# Patient Record
Sex: Female | Born: 1960 | Race: Black or African American | Hispanic: No | Marital: Married
Health system: Southern US, Community
[De-identification: ages and names within clinical notes are randomized; demographics above are authoritative.]

## PROBLEM LIST (undated history)

## (undated) DIAGNOSIS — E785 Hyperlipidemia, unspecified: Secondary | ICD-10-CM

## (undated) DIAGNOSIS — C50919 Malignant neoplasm of unspecified site of unspecified female breast: Secondary | ICD-10-CM

## (undated) HISTORY — PX: BREAST SURGERY: SHX581

---

## 2020-07-16 ENCOUNTER — Emergency Department (HOSPITAL_BASED_OUTPATIENT_CLINIC_OR_DEPARTMENT_OTHER): Payer: No Typology Code available for payment source

## 2020-07-16 ENCOUNTER — Other Ambulatory Visit: Payer: Self-pay

## 2020-07-16 ENCOUNTER — Encounter (HOSPITAL_BASED_OUTPATIENT_CLINIC_OR_DEPARTMENT_OTHER): Payer: Self-pay | Admitting: *Deleted

## 2020-07-16 ENCOUNTER — Emergency Department (HOSPITAL_BASED_OUTPATIENT_CLINIC_OR_DEPARTMENT_OTHER)
Admission: EM | Admit: 2020-07-16 | Discharge: 2020-07-16 | Disposition: A | Discharge status: Home / Self Care | Payer: No Typology Code available for payment source | Attending: Emergency Medicine | Admitting: Emergency Medicine

## 2020-07-16 DIAGNOSIS — R413 Other amnesia: Secondary | ICD-10-CM | POA: Diagnosis not present

## 2020-07-16 DIAGNOSIS — Z79899 Other long term (current) drug therapy: Secondary | ICD-10-CM | POA: Insufficient documentation

## 2020-07-16 DIAGNOSIS — I10 Essential (primary) hypertension: Secondary | ICD-10-CM | POA: Diagnosis not present

## 2020-07-16 DIAGNOSIS — R410 Disorientation, unspecified: Secondary | ICD-10-CM | POA: Insufficient documentation

## 2020-07-16 DIAGNOSIS — R519 Headache, unspecified: Secondary | ICD-10-CM

## 2020-07-16 HISTORY — DX: Malignant neoplasm of unspecified site of unspecified female breast: C50.919

## 2020-07-16 HISTORY — DX: Hyperlipidemia, unspecified: E78.5

## 2020-07-16 LAB — COMPREHENSIVE METABOLIC PANEL
ALT: 17 U/L (ref 0–44)
AST: 25 U/L (ref 15–41)
Albumin: 4 g/dL (ref 3.5–5.0)
Alkaline Phosphatase: 99 U/L (ref 38–126)
Anion gap: 13 (ref 5–15)
BUN: 11 mg/dL (ref 6–20)
CO2: 24 mmol/L (ref 22–32)
Calcium: 9.5 mg/dL (ref 8.9–10.3)
Chloride: 101 mmol/L (ref 98–111)
Creatinine, Ser: 0.9 mg/dL (ref 0.44–1.00)
GFR calc Af Amer: >60 mL/min (ref >60)
GFR calc non Af Amer: >60 mL/min (ref >60)
Glucose, Bld: 100 mg/dL — ABNORMAL HIGH (ref 70–99)
Potassium: 3.7 mmol/L (ref 3.5–5.1)
Sodium: 138 mmol/L (ref 135–145)
Total Bilirubin: 0.3 mg/dL (ref 0.3–1.2)
Total Protein: 8.2 g/dL — ABNORMAL HIGH (ref 6.5–8.1)

## 2020-07-16 LAB — CBC WITH DIFFERENTIAL/PLATELET
Abs Immature Granulocytes: 0.01 10*3/uL (ref 0.00–0.07)
Basophils Absolute: 0 10*3/uL (ref 0.0–0.1)
Basophils Relative: 0 %
Eosinophils Absolute: 0.2 10*3/uL (ref 0.0–0.5)
Eosinophils Relative: 3 %
HCT: 37.7 % (ref 36.0–46.0)
Hemoglobin: 12.1 g/dL (ref 12.0–15.0)
Immature Granulocytes: 0 %
Lymphocytes Relative: 36 %
Lymphs Abs: 2.7 10*3/uL (ref 0.7–4.0)
MCH: 27.2 pg (ref 26.0–34.0)
MCHC: 32.1 g/dL (ref 30.0–36.0)
MCV: 84.7 fL (ref 80.0–100.0)
Monocytes Absolute: 0.5 10*3/uL (ref 0.1–1.0)
Monocytes Relative: 6 %
Neutro Abs: 4.2 10*3/uL (ref 1.7–7.7)
Neutrophils Relative %: 55 %
Platelets: 263 10*3/uL (ref 150–400)
RBC: 4.45 MIL/uL (ref 3.87–5.11)
RDW: 15.6 % — ABNORMAL HIGH (ref 11.5–15.5)
WBC: 7.6 10*3/uL (ref 4.0–10.5)
nRBC: 0 % (ref 0.0–0.2)

## 2020-07-16 MED ORDER — ACETAMINOPHEN 325 MG PO TABS
650.0000 mg | ORAL_TABLET | Freq: Once | ORAL | Status: AC
  Administered 2020-07-16: 650 mg via ORAL
  Filled 2020-07-16: qty 2

## 2020-07-16 MED ORDER — AMLODIPINE BESYLATE 5 MG PO TABS
2.5000 mg | ORAL_TABLET | Freq: Once | ORAL | Status: AC
  Administered 2020-07-16: 2.5 mg via ORAL
  Filled 2020-07-16: qty 1

## 2020-07-16 MED ORDER — AMLODIPINE BESYLATE 2.5 MG PO TABS: 2.5000 mg | ORAL_TABLET | Freq: Every day | ORAL | 0 refills | Status: AC

## 2020-07-16 NOTE — ED Provider Notes (Signed)
[facility] EMERGENCY DEPARTMENT Provider Note   CSN: [id number] Arrival date & time: 07/16/20  1719     History Chief Complaint  Patient presents with  . Hypertension    Alicia Cabrera is a 59 y.o. female.  Patient with history of breast cancer, high cholesterol, no previous history of hypertension --presents to the emergency department for headache.  Patient reports that she woke up early this morning to take a flight to [place] from [place], to visit her brother.  While at the airport in [place], she developed a posterior headache.  This was mild at first.  During her flight her headache gradually worsened.  Upon arrival to [facility], she called her doctor who recommended that she be evaluated.  At urgent care she was found to have high blood pressure.  She was encouraged to come to the emergency department for evaluation of TIA, stroke, hypertensive emergency.  Here patient is requesting aspirin for her headache.  She denies vomiting or neck pain.  Patient denies signs of stroke including: facial droop, slurred speech, aphasia, weakness/numbness in extremities, imbalance/trouble walking.  She states that she has been getting headaches recently.  She is concerned that this is related to high blood pressure.  Patient also reports several episodes of confusion over the past 3 months.  She reports episodes where she has had difficulty finding her way to work as well as confusion while at work.  The symptoms are short-lived and pass.  She has not been seen for these episodes.  Patient denies any symptoms of illness including fevers, URI symptoms, cough, chest pain or shortness of breath.  She has not had any vomiting or diarrhea.        Past Medical History:  Diagnosis Date  . Breast CA ([place])   . Hyperlipidemia     There are no problems to display for this patient.   Past Surgical History:  Procedure Laterality Date  . BREAST SURGERY       OB History   No obstetric  history on file.     No family history on file.  Social History   Tobacco Use  . Smoking status: Not on file  Substance Use Topics  . Alcohol use: Not on file  . Drug use: Not on file    Home Medications Prior to Admission medications   Medication Sig Start Date End Date Taking? Authorizing Provider  amLODipine (NORVASC) 2.5 MG tablet Take 1 tablet (2.5 mg total) by mouth daily. 07/16/20   Carlisle Cater, PA-C  atorvastatin (LIPITOR) 20 MG tablet Take 20 mg by mouth at bedtime. 06/27/20   [provider]  Cholecalciferol 10 MCG (400 UNIT) CAPS Take by mouth.    [provider]  hydrocortisone 2.5 % cream Apply topically daily. 05/18/20   [provider]  ketoconazole (NIZORAL) 2 % cream Apply topically daily. 06/15/20   [provider]  triamcinolone cream (KENALOG) 0.1 % Apply topically 2 (two) times daily. 04/06/20   [provider]  triazolam (HALCION) 0.25 MG tablet Take by mouth as directed. 07/12/20   [provider]    Allergies    Patient has no known allergies.  Review of Systems   Review of Systems  Constitutional: Negative for fever.  HENT: Negative for congestion, dental problem, rhinorrhea and sinus pressure.   Eyes: Negative for photophobia, discharge, redness and visual disturbance.  Respiratory: Negative for shortness of breath.   Cardiovascular: Negative for chest pain.  Gastrointestinal: Negative for nausea and  vomiting.  Musculoskeletal: Negative for gait problem, neck pain and neck stiffness.  Skin: Negative for rash.  Neurological: Positive for headaches. Negative for syncope, speech difficulty, weakness, light-headedness and numbness.  Psychiatric/Behavioral: Positive for confusion.    Physical Exam Updated Vital Signs BP (!) 174/96 (BP Location: Right Arm)   Pulse 88   Temp 99.7 F (37.6 C) (Oral)   Resp 18   Ht 5\' 7"  (1.702 m)   Wt 92.1 kg   SpO2 99%   BMI 31.79 kg/m   Physical Exam Vitals  and nursing note reviewed.  Constitutional:      General: She is not in acute distress.    Appearance: She is well-developed.  HENT:     Head: Normocephalic and atraumatic.     Right Ear: Tympanic membrane, ear canal and external ear normal.     Left Ear: Tympanic membrane, ear canal and external ear normal.     Nose: Nose normal.     Mouth/Throat:     Pharynx: Uvula midline.  Eyes:     General: Lids are normal. No visual field deficit.    Extraocular Movements:     Right eye: No nystagmus.     Left eye: No nystagmus.     Conjunctiva/sclera: Conjunctivae normal.     Pupils: Pupils are equal, round, and reactive to light.  Neck:     Vascular: No carotid bruit.  Cardiovascular:     Rate and Rhythm: Normal rate and regular rhythm.     Heart sounds: No murmur heard.   Pulmonary:     Effort: Pulmonary effort is normal. No respiratory distress.     Breath sounds: Normal breath sounds. No wheezing, rhonchi or rales.  Abdominal:     Palpations: Abdomen is soft.     Tenderness: There is no abdominal tenderness. There is no guarding or rebound.  Musculoskeletal:     Cervical back: Normal range of motion and neck supple. No tenderness or bony tenderness.     Right lower leg: No edema.     Left lower leg: No edema.  Skin:    General: Skin is warm and dry.     Findings: No rash.  Neurological:     General: No focal deficit present.     Mental Status: She is alert and oriented to person, place, and time. Mental status is at baseline.     GCS: GCS eye subscore is 4. GCS verbal subscore is 5. GCS motor subscore is 6.     Cranial Nerves: No cranial nerve deficit, dysarthria or facial asymmetry.     Sensory: No sensory deficit.     Motor: No weakness.     Coordination: Coordination normal.     Gait: Gait normal.  Psychiatric:        Mood and Affect: Mood normal.     ED Results / Procedures / Treatments   Labs (all labs ordered are listed, but only abnormal results are  displayed) Labs Reviewed  CBC WITH DIFFERENTIAL/PLATELET - Abnormal; Notable for the following components:      Result Value   RDW 15.6 (*)    All other components within normal limits  COMPREHENSIVE METABOLIC PANEL - Abnormal; Notable for the following components:   Glucose, Bld 100 (*)    Total Protein 8.2 (*)    All other components within normal limits    EKG None  Radiology CT Head Wo Contrast  Result Date: 07/16/2020 CLINICAL DATA:  Mental status change. Hypertensive emergency. Headache.  EXAM: CT HEAD WITHOUT CONTRAST TECHNIQUE: Contiguous axial images were obtained from the base of the skull through the vertex without intravenous contrast. COMPARISON:  None. FINDINGS: Brain: Brain volume is normal for age. No intracranial hemorrhage, mass effect, or midline shift. No hydrocephalus. The basilar cisterns are patent. No evidence of territorial infarct or acute ischemia. No extra-axial or intracranial fluid collection. Vascular: No hyperdense vessel or unexpected calcification. Skull: No fracture or focal lesion. Sinuses/Orbits: Paranasal sinuses and mastoid air cells are clear. The visualized orbits are unremarkable. Other: None. IMPRESSION: Negative noncontrast head CT. Electronically Signed   By: Keith Rake M.D.   On: 07/16/2020 18:37    Procedures Procedures (including critical care time)  Medications Ordered in ED Medications  amLODipine (NORVASC) tablet 2.5 mg (2.5 mg Oral Given 07/16/20 2233)  acetaminophen (TYLENOL) tablet 650 mg (650 mg Oral Given 07/16/20 2233)    ED Course  I have reviewed the triage vital signs and the nursing notes.  Pertinent labs & imaging results that were available during my care of the patient were reviewed by me and considered in my medical decision making (see chart for details).  Patient seen and examined.  Patient is awake, alert, pleasant and conversant.  She has a normal neurologic exam here.  She only complains of headache at the  current time.  Temperature rechecked and was 98.7 F.  Blood pressure has been elevated.  Blood work and CT scan of the brain were reassuring.  At this point, discussed with patient that do not feel that she needs to be admitted for her symptoms.  The episodes of confusion have been frustrating for the patient, but have been ongoing for several months.  She has been having frequent headaches during this time.  Headache without concerning features today.  I have low concern for occult bleeding at this time.  Patient will be started on low-dose amlodipine 2.5 mg daily.  Patient has follow-up with her doctor upon returning to [place] in 4 days.  She is encouraged to follow-up with them.  We had a long discussion about her memory issues and that she will need a medical work-up for this.  She will also need her blood pressure rechecked and medications potentially titrated.  She verbalizes understanding.  She does not want to stay in the hospital and is requesting discharge.  We had a long discussion about signs and symptoms which should cause her to call and ambulance. Patient counseled to return if they have weakness in their arms or legs, slurred speech, trouble walking or talking, confusion, trouble with their balance, or if they have any other concerns. Patient verbalizes understanding and agrees with plan.   Vital signs reviewed and are as follows: BP (!) 174/96 (BP Location: Right Arm)   Pulse 88   Temp 99.7 F (37.6 C) (Oral)   Resp 18   Ht 5\' 7"  (1.702 m)   Wt 92.1 kg   SpO2 99%   BMI 31.79 kg/m       MDM Rules/Calculators/A&P                          Headache: Patient without high-risk features of headache including: sudden onset/thunderclap HA, no similar headache in past, altered mental status, accompanying seizure, headache with exertion, history of immunocompromise, neck or shoulder pain, fever, use of anticoagulation, family history of spontaneous SAH, concomitant drug use, toxic  exposure.   Patient has a normal complete neurological exam, normal  vital signs, normal level of consciousness, no signs of meningismus, is well-appearing/non-toxic appearing, no signs of trauma, no pain over the temporal arteries.   Head CT performed and is negative for bleeding or mass.   No dangerous or life-threatening conditions suspected or identified by history, physical exam, and by work-up. No indications for hospitalization identified.   HTN: New onset.  Given symptomatology, will start patient on low-dose of blood pressure medication.  Normal kidney function noted.  Patient will follow up next week with her doctor for recheck.  Confusion spells: Ongoing over the past several months.  Patient with normal neurological exam at this time.  She is fully oriented and gives detailed history of events today.  I have low concern for CVA or TIA.  At this point, do not feel that she requires emergent hospitalization but will require close follow-up with her doctor.  Strict return instructions as discussed above.  Final Clinical Impression(s) / ED Diagnoses Final diagnoses:  Acute nonintractable headache, unspecified headache type  Hypertension, unspecified type  Memory problem    Rx / DC Orders ED Discharge Orders         Ordered    amLODipine (NORVASC) 2.5 MG tablet  Daily        07/16/20 2238           Carlisle Cater, PA-C 07/16/20 2251    Maudie Flakes, MD 07/16/20 2312

## 2020-07-16 NOTE — Discharge Instructions (Signed)
Please read and follow all provided instructions.  Your diagnoses today include:  1. Acute nonintractable headache, unspecified headache type   2. Hypertension, unspecified type   3. Memory problem     Tests performed today include:  CT of your head which was normal and did not show any serious cause of your headache  Blood counts and electrolytes - were okay  Vital signs. See below for your results today.   Medications:   Amlodipine - medication for high blood pressure  Take any prescribed medications only as directed.  Additional information:  Follow any educational materials contained in this packet.  You are having a headache. No specific cause was found today for your headache. It may have been a migraine or other cause of headache. Stress, anxiety, fatigue, and depression are common triggers for headaches.   Your headache today does not appear to be life-threatening or require hospitalization, but often the exact cause of headaches is not determined in the emergency department. Therefore, follow-up with your doctor is very important to find out what may have caused your headache and whether or not you need any further diagnostic testing or treatment.   Sometimes headaches can appear benign (not harmful), but then more serious symptoms can develop which should prompt an immediate re-evaluation by your doctor or the emergency department.  BE VERY CAREFUL not to take multiple medicines containing Tylenol (also called acetaminophen). Doing so can lead to an overdose which can damage your liver and cause liver failure and possibly death.   Follow-up instructions: Please follow-up with your primary care provider in the next 3 days for further evaluation of your symptoms.   Return instructions:   Please return to the Emergency Department if you experience worsening symptoms.  Return if you have weakness in your arms or legs, slurred speech, trouble walking or talking,  confusion, or trouble with your balance.   Return if the medications do not resolve your headache, if it recurs, or if you have multiple episodes of vomiting or cannot keep down fluids.  Return if you have a change from the usual headache.  RETURN IMMEDIATELY IF you:  Develop a sudden, severe headache  Develop confusion or become poorly responsive or faint  Develop a fever above 100.5F or problem breathing  Have a change in speech, vision, swallowing, or understanding  Develop new weakness, numbness, tingling, incoordination in your arms or legs  Have a seizure  Please return if you have any other emergent concerns.  Additional Information:  Your vital signs today were: BP (!) 174/96 (BP Location: Right Arm)   Pulse 88   Temp 99.7 F (37.6 C) (Oral)   Resp 18   Ht 5\' 7"  (1.702 m)   Wt 92.1 kg   SpO2 99%   BMI 31.79 kg/m  If your blood pressure (BP) was elevated above 135/85 this visit, please have this repeated by your doctor within one month. --------------

## 2020-07-16 NOTE — ED Triage Notes (Addendum)
Pt c/o increased BP , h/a  And episodes  confusion x 3 days , sent here from [facility] for eval

## 2021-12-19 IMAGING — CT CT HEAD W/O CM
3 series · 16 of 47 positions shown, 19 images · non-contrast
Comparison: None.

CLINICAL DATA: Mental status change. Hypertensive emergency.
Headache.

EXAM:
CT HEAD WITHOUT CONTRAST
TECHNIQUE: Contiguous axial images were obtained from the base of the skull
through the vertex without intravenous contrast.

[Series 2: head wo · axial · 0.43mm/px · z∈[-159,-34]mm · 10 of 31 slices shown, 13 images]
[im 3/31  brain]
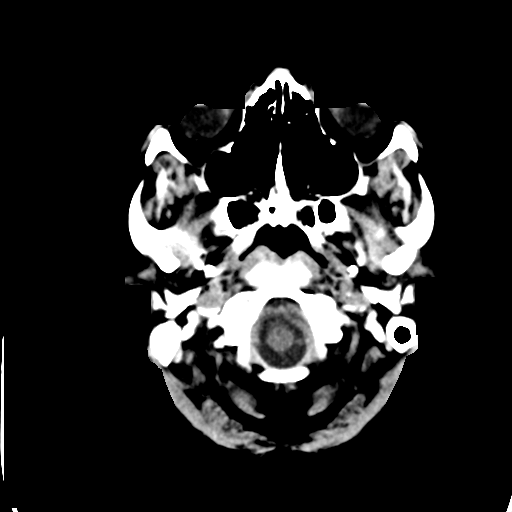
[im 3/31  bone]
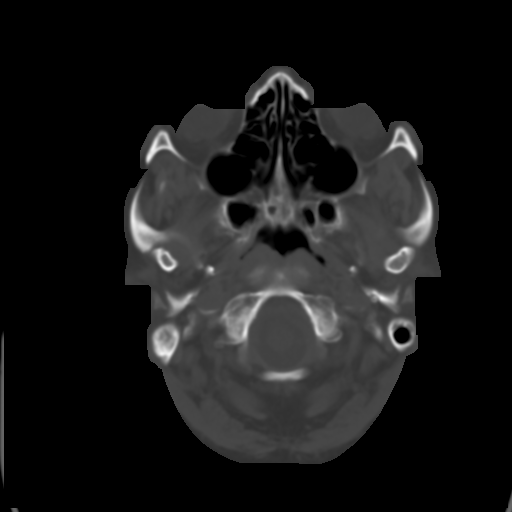
[im 6/31  brain]
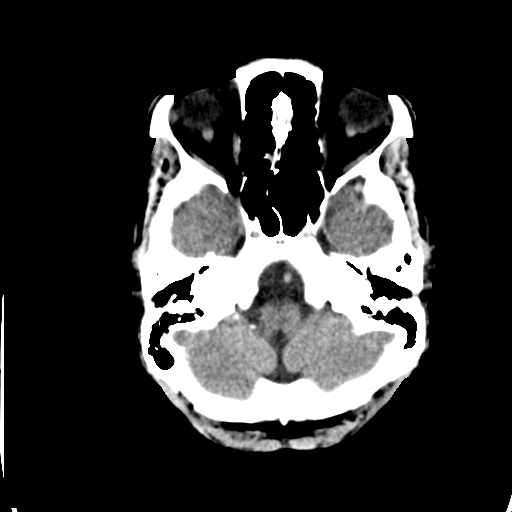
[im 9/31  brain]
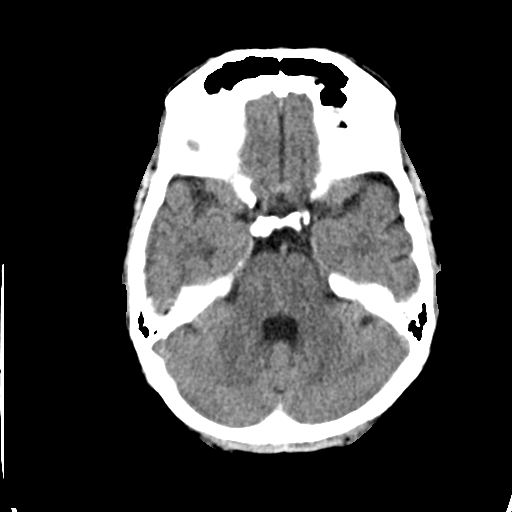
[im 11/31  brain]
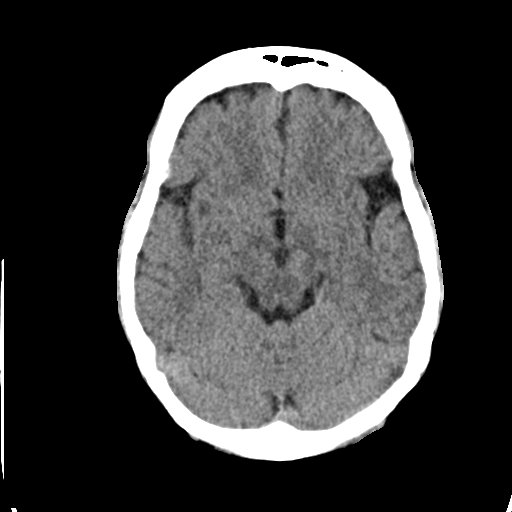
[im 14/31  brain]
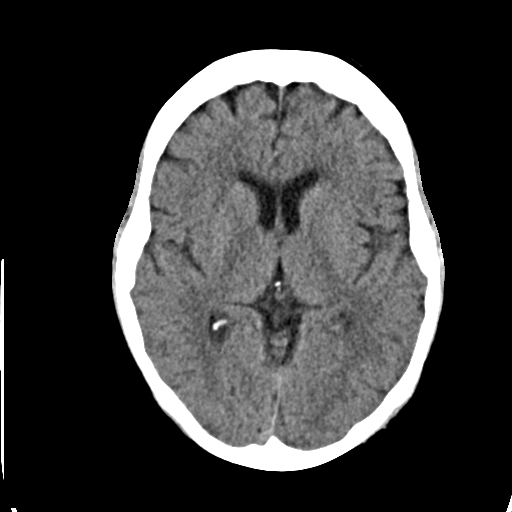
[im 14/31  bone]
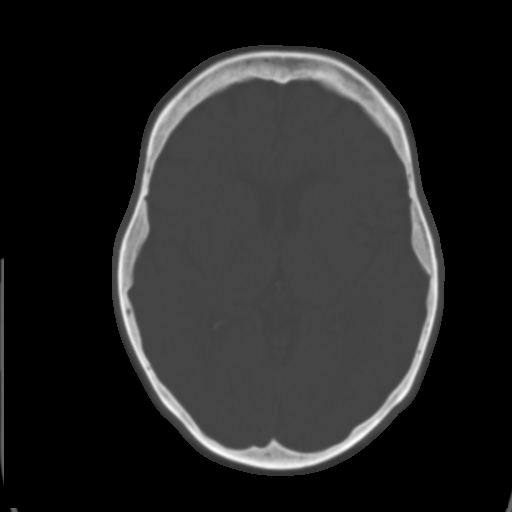
[im 17/31  brain]
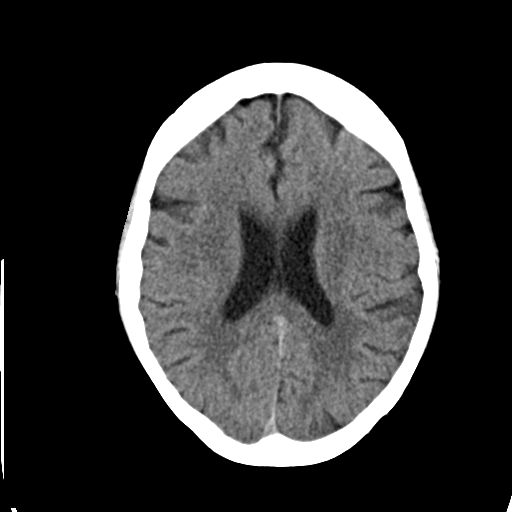
[im 20/31  brain]
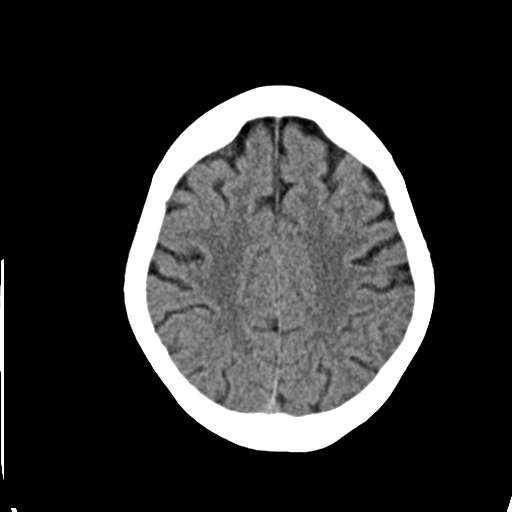
[im 23/31  brain]
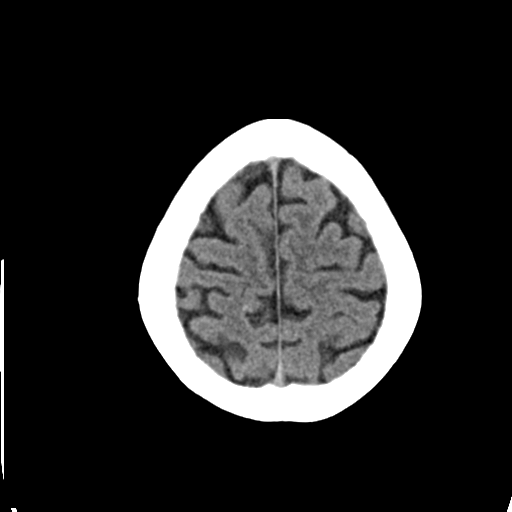
[im 25/31  brain]
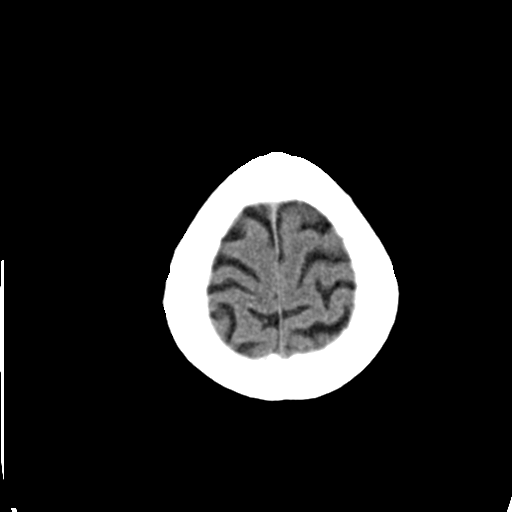
[im 25/31  bone]
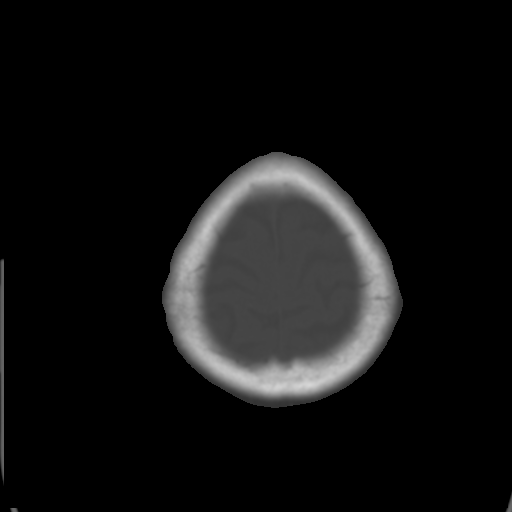
[im 28/31  brain]
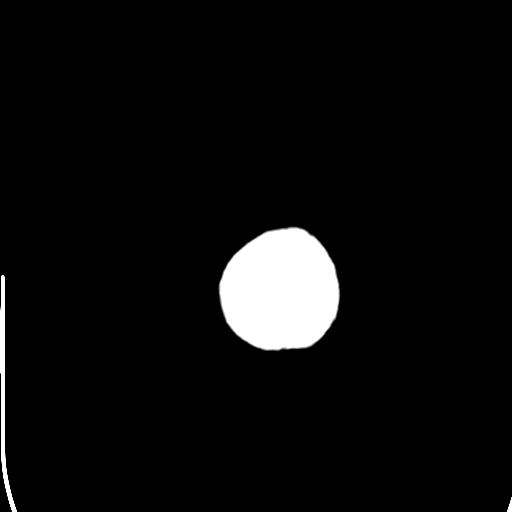

[Series 4: coronal soft · coronal · 0.32mm/px · 3 of 67 slices shown]
[im 23/67  brain]
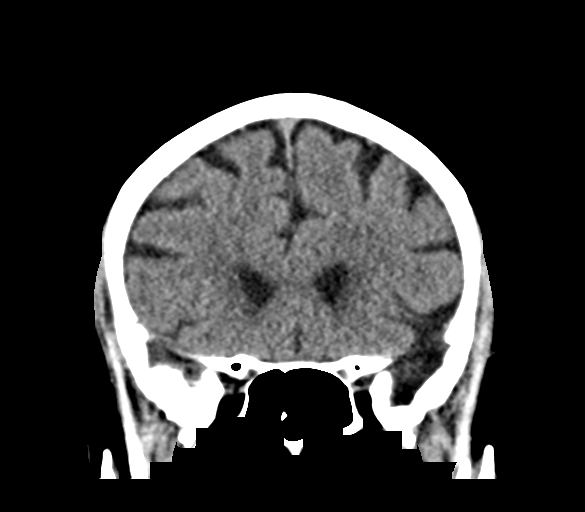
[im 30/67  brain]
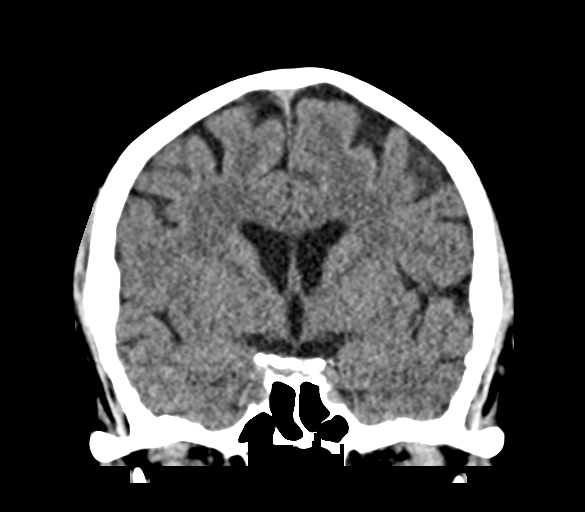
[im 37/67  brain]
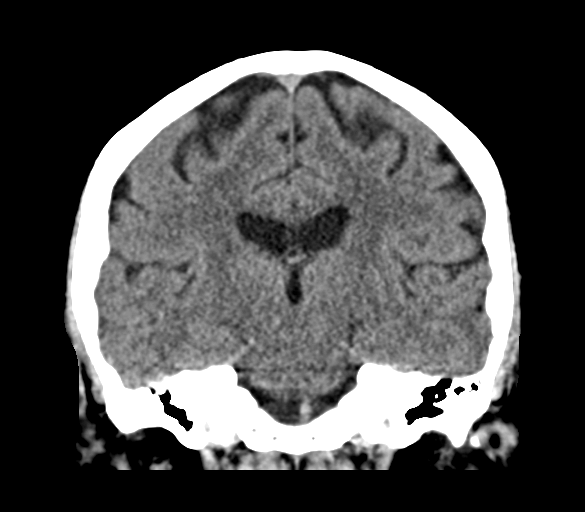

[Series 5: sag soft · sagittal · 0.32mm/px · 3 of 54 slices shown]
[im 18/54  brain]
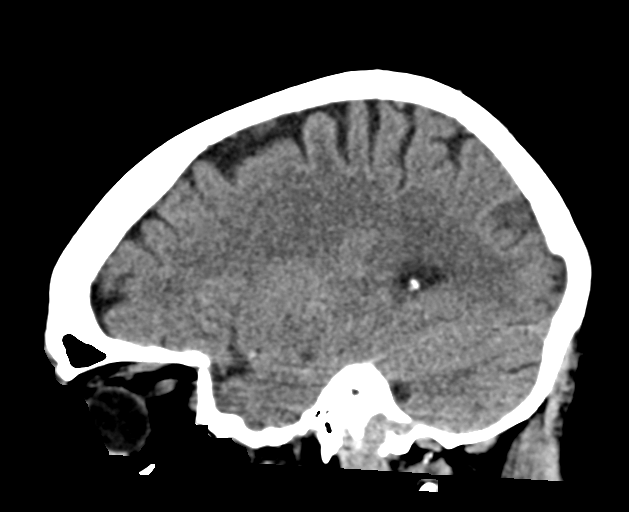
[im 27/54  brain]
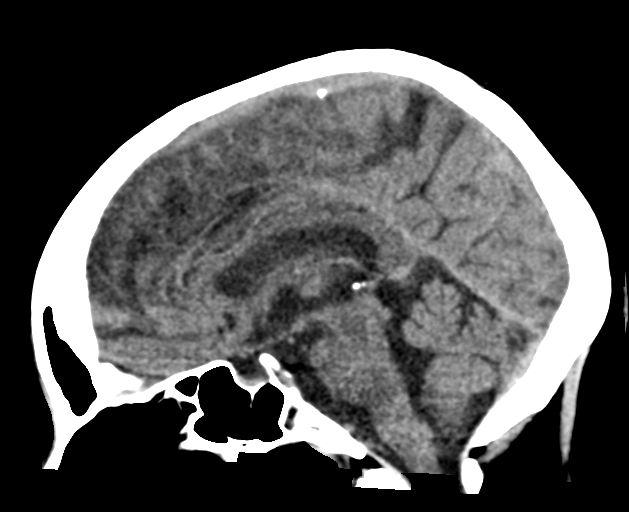
[im 36/54  brain]
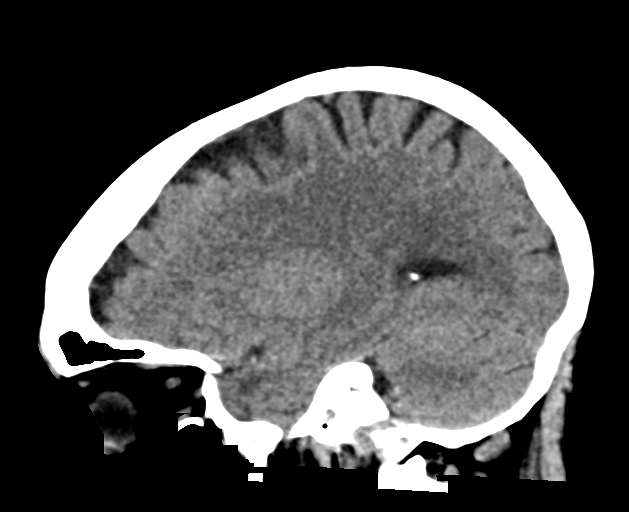

[16 of 47 positions shown; findings below may reference images not displayed]

FINDINGS: Brain: Brain volume is normal for age. No intracranial hemorrhage,
mass effect, or midline shift. No hydrocephalus. The basilar
cisterns are patent. No evidence of territorial infarct or acute
ischemia. No extra-axial or intracranial fluid collection.

Vascular: No hyperdense vessel or unexpected calcification.

Skull: No fracture or focal lesion.

Sinuses/Orbits: Paranasal sinuses and mastoid air cells are clear.
The visualized orbits are unremarkable.

Other: None.
IMPRESSION: Negative noncontrast head CT.
